# Patient Record
Sex: Male | Born: 1990 | Hispanic: Yes | Marital: Single | Smoking: Never smoker
Health system: Southern US, Community
[De-identification: ages and names within clinical notes are randomized; demographics above are authoritative.]

---

## 2010-06-03 ENCOUNTER — Emergency Department: Payer: Self-pay | Admitting: Internal Medicine

## 2014-05-06 ENCOUNTER — Emergency Department: Payer: Self-pay | Admitting: Emergency Medicine

## 2018-03-06 ENCOUNTER — Emergency Department: Payer: BLUE CROSS/BLUE SHIELD

## 2018-03-06 ENCOUNTER — Other Ambulatory Visit: Payer: Self-pay

## 2018-03-06 ENCOUNTER — Encounter: Payer: Self-pay | Admitting: *Deleted

## 2018-03-06 ENCOUNTER — Emergency Department
Admission: EM | Admit: 2018-03-06 | Discharge: 2018-03-06 | Disposition: A | Discharge status: Home / Self Care | Payer: BLUE CROSS/BLUE SHIELD | Attending: Emergency Medicine | Admitting: Emergency Medicine

## 2018-03-06 DIAGNOSIS — R066 Hiccough: Secondary | ICD-10-CM | POA: Insufficient documentation

## 2018-03-06 LAB — CBC
HCT: 48.8 % (ref 40.0–52.0)
Hemoglobin: 17.2 g/dL (ref 13.0–18.0)
MCH: 31.3 pg (ref 26.0–34.0)
MCHC: 35.4 g/dL (ref 32.0–36.0)
MCV: 88.3 fL (ref 80.0–100.0)
PLATELETS: 162 10*3/uL (ref 150–440)
RBC: 5.52 MIL/uL (ref 4.40–5.90)
RDW: 13.1 % (ref 11.5–14.5)
WBC: 4.9 10*3/uL (ref 3.8–10.6)

## 2018-03-06 LAB — BASIC METABOLIC PANEL
Anion gap: 9 (ref 5–15)
BUN: 12 mg/dL (ref 6–20)
CALCIUM: 8.7 mg/dL — AB (ref 8.9–10.3)
CHLORIDE: 102 mmol/L (ref 101–111)
CO2: 27 mmol/L (ref 22–32)
Creatinine, Ser: 0.73 mg/dL (ref 0.61–1.24)
GFR calc Af Amer: >60 mL/min (ref >60)
GFR calc non Af Amer: >60 mL/min (ref >60)
Glucose, Bld: 84 mg/dL (ref 65–99)
Potassium: 3.2 mmol/L — ABNORMAL LOW (ref 3.5–5.1)
Sodium: 138 mmol/L (ref 135–145)

## 2018-03-06 LAB — TROPONIN I: Troponin I: <0.03 ng/mL (ref <0.03)

## 2018-03-06 MED ORDER — CHLORPROMAZINE HCL 50 MG PO TABS: 50.0000 mg | ORAL_TABLET | Freq: Three times a day (TID) | ORAL | 0 refills | Status: DC | PRN

## 2018-03-06 MED ORDER — GI COCKTAIL ~~LOC~~
30.0000 mL | Freq: Once | ORAL | Status: AC
  Administered 2018-03-06: 30 mL via ORAL
  Filled 2018-03-06: qty 30

## 2018-03-06 MED ORDER — CHLORPROMAZINE HCL 25 MG/ML IJ SOLN
50.0000 mg | Freq: Once | INTRAMUSCULAR | Status: AC
  Administered 2018-03-06: 50 mg via INTRAMUSCULAR
  Filled 2018-03-06: qty 2

## 2018-03-06 NOTE — ED Notes (Signed)
No answer when called from lobby x1 

## 2018-03-06 NOTE — ED Notes (Signed)
Pt given ginger ale and tolerated well.  

## 2018-03-06 NOTE — ED Notes (Signed)
Patient up to nurse first asking how long before he goes back to a room. Has been sleeping in the corner of lobby. Updated on delay

## 2018-03-06 NOTE — ED Notes (Signed)
No answer when called from lobby x3. 

## 2018-03-06 NOTE — ED Notes (Signed)
Pt reports his hiccups are gone at this time.

## 2018-03-06 NOTE — ED Provider Notes (Signed)
[facility] Emergency Department Provider Note   ____________________________________________   First MD Initiated Contact with Patient 03/06/18 0425     (approximate)  I have reviewed the triage vital signs and the nursing notes.   HISTORY  Chief Complaint Hiccups    HPI Lawrence Arellano is a 27 y.o. male who presents to the ED from home with a chief complaint of hiccups.  Patient reports a 2-day history of hiccups, heartburn and belching.  The day before he went to a nonlocal urgent care and received a shot for allergic reaction.  Patient believes this shot precipitated his intractable hiccups.  Denies associated fever, chills, chest pain, shortness of breath, abdominal pain, nausea or vomiting.  Denies recent travel or trauma.  Endorses spicy foods.   Past medical history None  There are no active problems to display for this patient.   History reviewed. No pertinent surgical history.  Prior to Admission medications   Not on File    Allergies Patient has no known allergies.  No family history on file.  Social History Social History   Tobacco Use  . Smoking status: Never Smoker  Substance Use Topics  . Alcohol use: Yes    Frequency: Never  . Drug use: Never    Review of Systems  Constitutional: No fever/chills Eyes: No visual changes. ENT: No sore throat. Cardiovascular: Denies chest pain. Respiratory: Denies shortness of breath. Gastrointestinal: Positive for hiccups.  No abdominal pain.  No nausea, no vomiting.  No diarrhea.  No constipation. Genitourinary: Negative for dysuria. Musculoskeletal: Negative for back pain. Skin: Negative for rash. Neurological: Negative for headaches, focal weakness or numbness.   ____________________________________________   PHYSICAL EXAM:  VITAL SIGNS: ED Triage Vitals  Enc Vitals Group     BP 03/06/18 0008 130/65     Pulse Rate 03/06/18 0008 69     Resp 03/06/18 0008 20   Temp 03/06/18 0008 98.4 F (36.9 C)     Temp Source 03/06/18 0008 Oral     SpO2 03/06/18 0008 98 %     Weight 03/06/18 0012 140 lb (63.5 kg)     Height 03/06/18 0012 5\' 6"  (1.676 m)     Head Circumference --      Peak Flow --      Pain Score 03/06/18 0011 6     Pain Loc --      Pain Edu? --      Excl. in GC? --     Constitutional: Alert and oriented. Well appearing and in mild acute distress. Eyes: Conjunctivae are normal. PERRL. EOMI. Head: Atraumatic. Nose: No congestion/rhinnorhea. Mouth/Throat: Mucous membranes are moist.  Oropharynx non-erythematous. Neck: No stridor.   Cardiovascular: Normal rate, regular rhythm. Grossly normal heart sounds.  Good peripheral circulation. Respiratory: Normal respiratory effort.  No retractions. Lungs CTAB. Gastrointestinal: Active hiccups.  Soft and nontender to light or deep palpation. No distention. No abdominal bruits. No CVA tenderness. Musculoskeletal: No lower extremity tenderness nor edema.  No joint effusions. Neurologic:  Normal speech and language. No gross focal neurologic deficits are appreciated. No gait instability. Skin:  Skin is warm, dry and intact. No rash noted. Psychiatric: Mood and affect are normal. Speech and behavior are normal.  ____________________________________________   LABS (all labs ordered are listed, but only abnormal results are displayed)  Labs Reviewed  BASIC METABOLIC PANEL - Abnormal; Notable for the following components:      Result Value   Potassium 3.2 (*)  Calcium 8.7 (*)    All other components within normal limits  CBC  TROPONIN I   ____________________________________________  EKG  None ____________________________________________  RADIOLOGY  ED MD interpretation: No acute cardiopulmonary process  Official radiology report(s): Dg Chest 2 View  Result Date: 03/06/2018 CLINICAL DATA:  Heartburn, belching EXAM: CHEST - 2 VIEW COMPARISON:  None. FINDINGS: The heart size and  mediastinal contours are within normal limits. Both lungs are clear. The visualized skeletal structures are unremarkable. IMPRESSION: No active cardiopulmonary disease. Electronically Signed   By: Jasmine PangKim  Fujinaga M.D.   On: 03/06/2018 00:59    ____________________________________________   PROCEDURES  Procedure(s) performed: None  Procedures  Critical Care performed: No  ____________________________________________   INITIAL IMPRESSION / ASSESSMENT AND PLAN / ED COURSE  As part of my medical decision making, I reviewed the following data within the electronic MEDICAL RECORD NUMBER Nursing notes reviewed and incorporated, Labs reviewed, Old chart reviewed, Radiograph reviewed  and Notes from prior ED visits   27 year old male who presents with intractable hiccups and heartburn.  Differential diagnosis includes but is not limited to esophagitis, gastritis, GERD, SBO, etc.  Initial laboratory and chest x-ray results unremarkable.  Will administer IM Thorazine, GI cocktail and reassess.   Clinical Course as of Mar 06 554  Sat Mar 06, 2018  0554 Hiccups resolved.  Patient was able to tolerate PO without hiccups.  Will discharge home with prescription for Thorazine to use as needed.  Strict return precautions given.  Patient verbalizes understanding and agrees with plan of care.   [JS]    Clinical Course User Index [JS] Irean HongSung, Jade J, MD     ____________________________________________   FINAL CLINICAL IMPRESSION(S) / ED DIAGNOSES  Final diagnoses:  Hiccups     ED Discharge Orders    None       Note:  This document was prepared using Dragon voice recognition software and may include unintentional dictation errors.    Irean HongSung, Jade J, MD 03/06/18 0630

## 2018-03-06 NOTE — Discharge Instructions (Addendum)
1.  You may take Thorazine 3 times daily as needed for hiccups. 2.  Return to the ER for worsening symptoms, persistent vomiting, difficulty breathing or other concerns.

## 2018-03-06 NOTE — ED Notes (Signed)
Pt reports he was seen at urgent care in [place] on Wednesday for an allergic reaction and given a shot of medication. On Thursday he started having hiccups and reflux type sx. Pt reports last BM was today and has been passing gas.

## 2018-03-06 NOTE — ED Notes (Signed)
No answer when called from lobby x2. 

## 2018-03-06 NOTE — ED Triage Notes (Signed)
Pt reports heartburn, hiccups, and belching, since yesterday. He took OTC med today for heart burn without relief.

## 2018-11-25 ENCOUNTER — Other Ambulatory Visit: Payer: Self-pay

## 2018-11-25 ENCOUNTER — Emergency Department
Admission: EM | Admit: 2018-11-25 | Discharge: 2018-11-25 | Disposition: A | Discharge status: Home / Self Care | Payer: BLUE CROSS/BLUE SHIELD | Attending: Student in an Organized Health Care Education/Training Program | Admitting: Student in an Organized Health Care Education/Training Program

## 2018-11-25 ENCOUNTER — Encounter: Payer: Self-pay | Admitting: Emergency Medicine

## 2018-11-25 DIAGNOSIS — J101 Influenza due to other identified influenza virus with other respiratory manifestations: Secondary | ICD-10-CM | POA: Insufficient documentation

## 2018-11-25 DIAGNOSIS — J111 Influenza due to unidentified influenza virus with other respiratory manifestations: Secondary | ICD-10-CM

## 2018-11-25 LAB — INFLUENZA PANEL BY PCR (TYPE A & B)
Influenza A By PCR: NEGATIVE
Influenza B By PCR: POSITIVE — AB

## 2018-11-25 NOTE — ED Triage Notes (Signed)
Patient ambulatory to triage with steady gait, without difficulty or distress noted, mask in place; pt reports fever today accomp by cough & congestion; pt did travel to [place] 2wks ago

## 2018-11-25 NOTE — ED Provider Notes (Signed)
[facility] Emergency Department Provider Note ____________________________________________  Time seen: 2219  I have reviewed the triage vital signs and the nursing notes.  HISTORY  Chief Complaint  Fever  HPI Lawrence Arellano is a 28 y.o. male presents himself to the ED for evaluation of sudden onset of fevers, cough, congestion, malaise, and eye irritation.  Patient denies any sick contacts, but does admit to a trip to [place] 2 weeks prior.  He did not receive the seasonal flu vaccine.  He has been using Benadryl for eye redness and itching.  He denies any nausea, vomiting, chest pain, shortness of breath, or syncope.  History reviewed. No pertinent past medical history.  There are no active problems to display for this patient.  History reviewed. No pertinent surgical history.  Prior to Admission medications   Not on File    Allergies Patient has no known allergies.  No family history on file.  Social History Social History   Tobacco Use  . Smoking status: Never Smoker  . Smokeless tobacco: Never Used  Substance Use Topics  . Alcohol use: Yes    Frequency: Never  . Drug use: Never    Review of Systems  Constitutional: Significant for fever. Eyes: Negative for visual changes. ENT: Negative for sore throat.  Reports nasal congestion. Cardiovascular: Negative for chest pain. Respiratory: Negative for shortness of breath.  Reports intermittent cough. Gastrointestinal: Negative for abdominal pain, vomiting and diarrhea. Genitourinary: Negative for dysuria. Musculoskeletal: Negative for back pain. Skin: Negative for rash. Neurological: Negative for headaches, focal weakness or numbness. ____________________________________________  PHYSICAL EXAM:  VITAL SIGNS: ED Triage Vitals [11/25/18 2120]  Enc Vitals Group     BP 140/85     Pulse Rate 93     Resp 18     Temp 99.3 F (37.4 C)     Temp Source Oral     SpO2 98 %     Weight 142  lb (64.4 kg)     Height 5\' 6"  (1.676 m)     Head Circumference      Peak Flow      Pain Score 0     Pain Loc      Pain Edu?      Excl. in GC?     Constitutional: Alert and oriented. Well appearing and in no distress. Head: Normocephalic and atraumatic. Eyes: Conjunctivae are normal. Normal extraocular movements Cardiovascular: Normal rate, regular rhythm. Normal distal pulses. Respiratory: Normal respiratory effort. No wheezes/rales/rhonchi. Gastrointestinal: Soft and nontender. No distention. Musculoskeletal: Nontender with normal range of motion in all extremities.  Neurologic:  Normal gait without ataxia. Normal speech and language. No gross focal neurologic deficits are appreciated. Skin:  Skin is warm, dry and intact. No rash noted. ____________________________________________   LABS (pertinent positives/negatives) Labs Reviewed  INFLUENZA PANEL BY PCR (TYPE A & B) - Abnormal; Notable for the following components:      Result Value   Influenza B By PCR POSITIVE (*)    All other components within normal limits  ____________________________________________  PROCEDURES  Procedures ____________________________________________  INITIAL IMPRESSION / ASSESSMENT AND PLAN / ED COURSE  With ED evaluation of sudden onset of fevers, cough, congestion, and malaise.  Patient was found to be influenza B positive on his PCR screen.  He is not inclined to take Tamiflu at this time.  He is discharged with instructions to take over-the-counter medications for symptom relief.  He will follow with the primary provider or return to the  ED as needed.  A work note is provided for the remainder of the week. ____________________________________________  FINAL CLINICAL IMPRESSION(S) / ED DIAGNOSES  Final diagnoses:  Influenza      Karmen Stabs, Charlesetta Ivory, PA-C 11/25/18 2257    Willy Eddy, MD 11/25/18 2257

## 2018-11-25 NOTE — Discharge Instructions (Signed)
You have tested positive for the flu. Take OTC Benadryl, Tylenol, and Motrin for symptoms. Rest and drink plenty of fluids to get better.

## 2019-01-22 IMAGING — CR DG CHEST 2V
2 series · 2 of 2 positions shown · non-contrast
Comparison: None.

CLINICAL DATA: Heartburn, belching

EXAM:
CHEST - 2 VIEW

[chest pa]
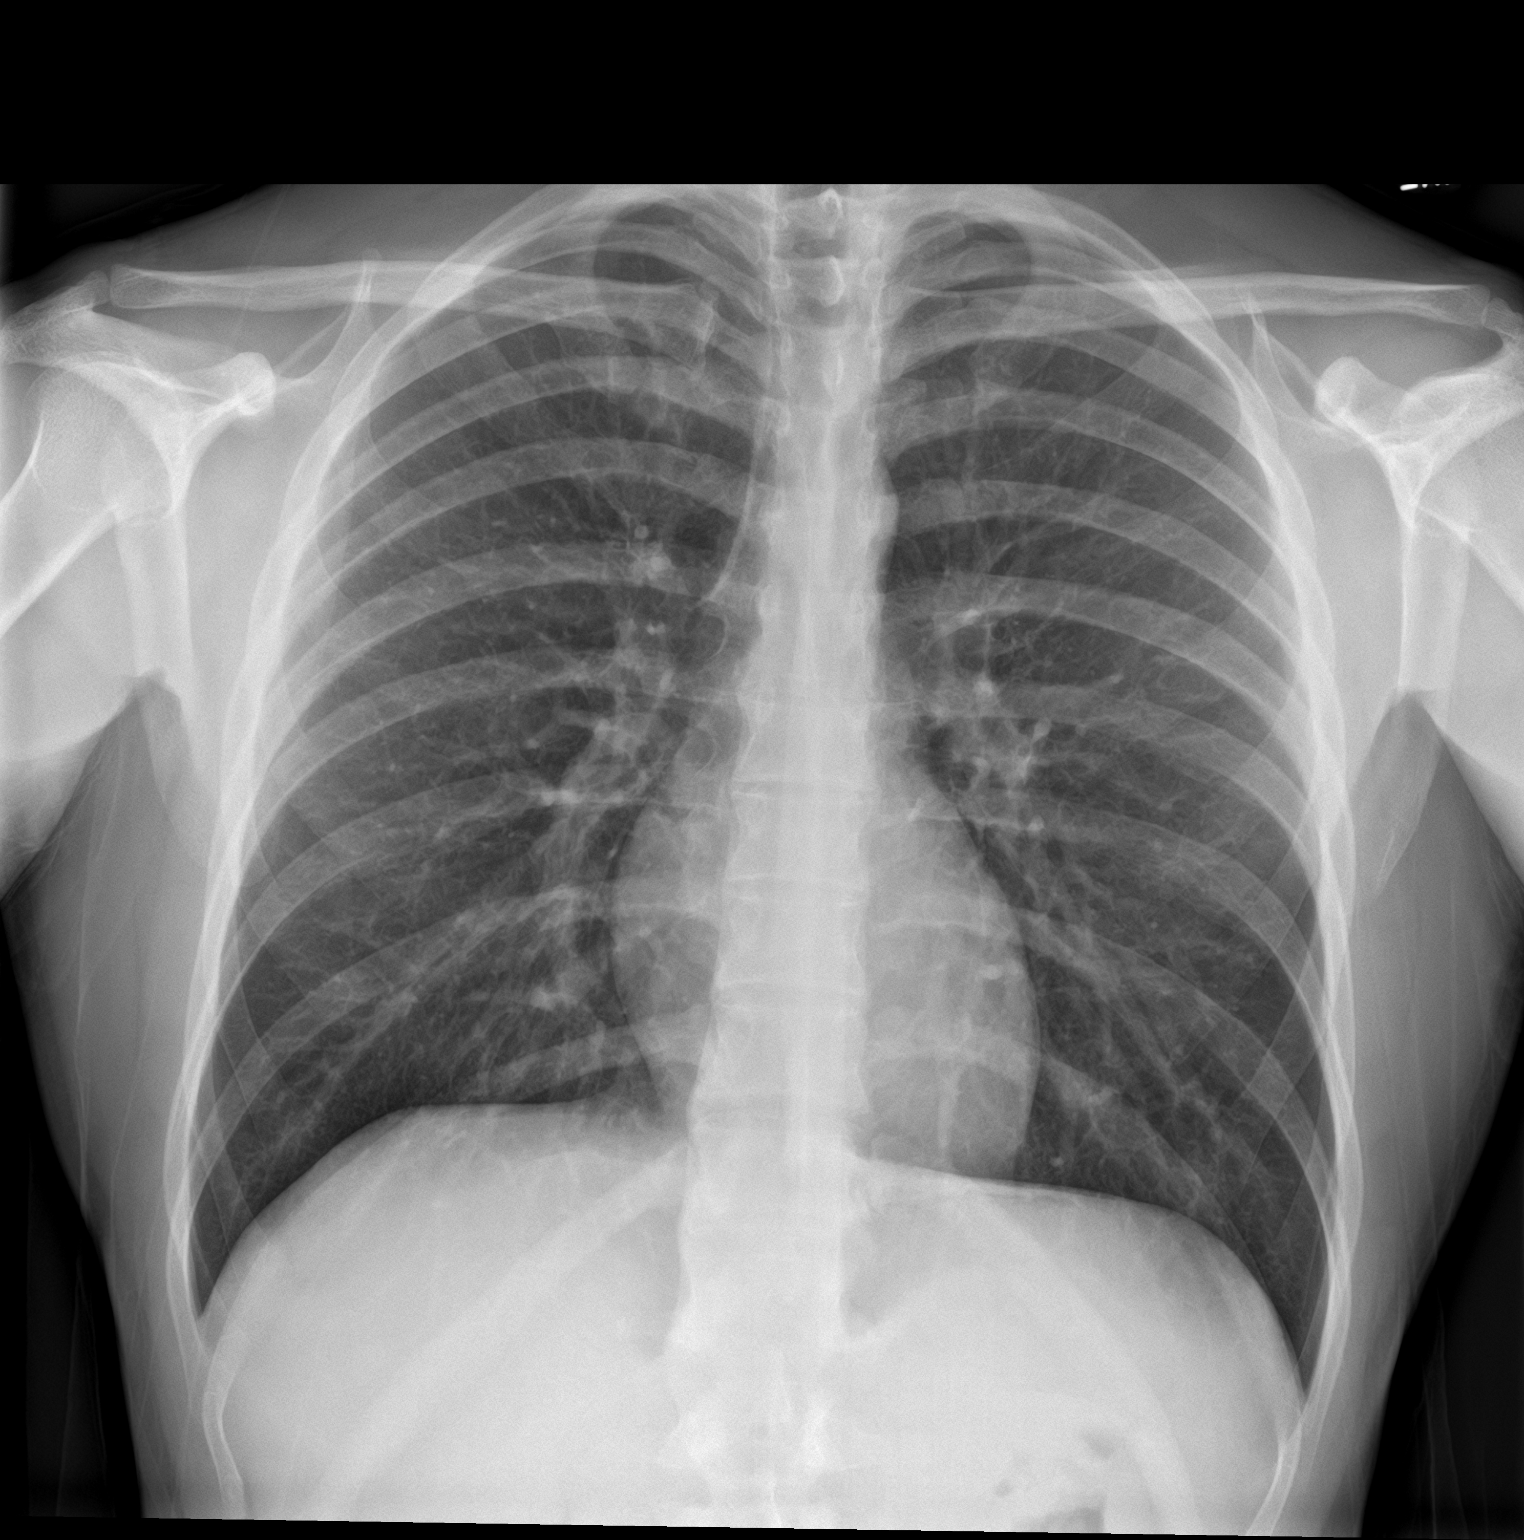

[chest lat]
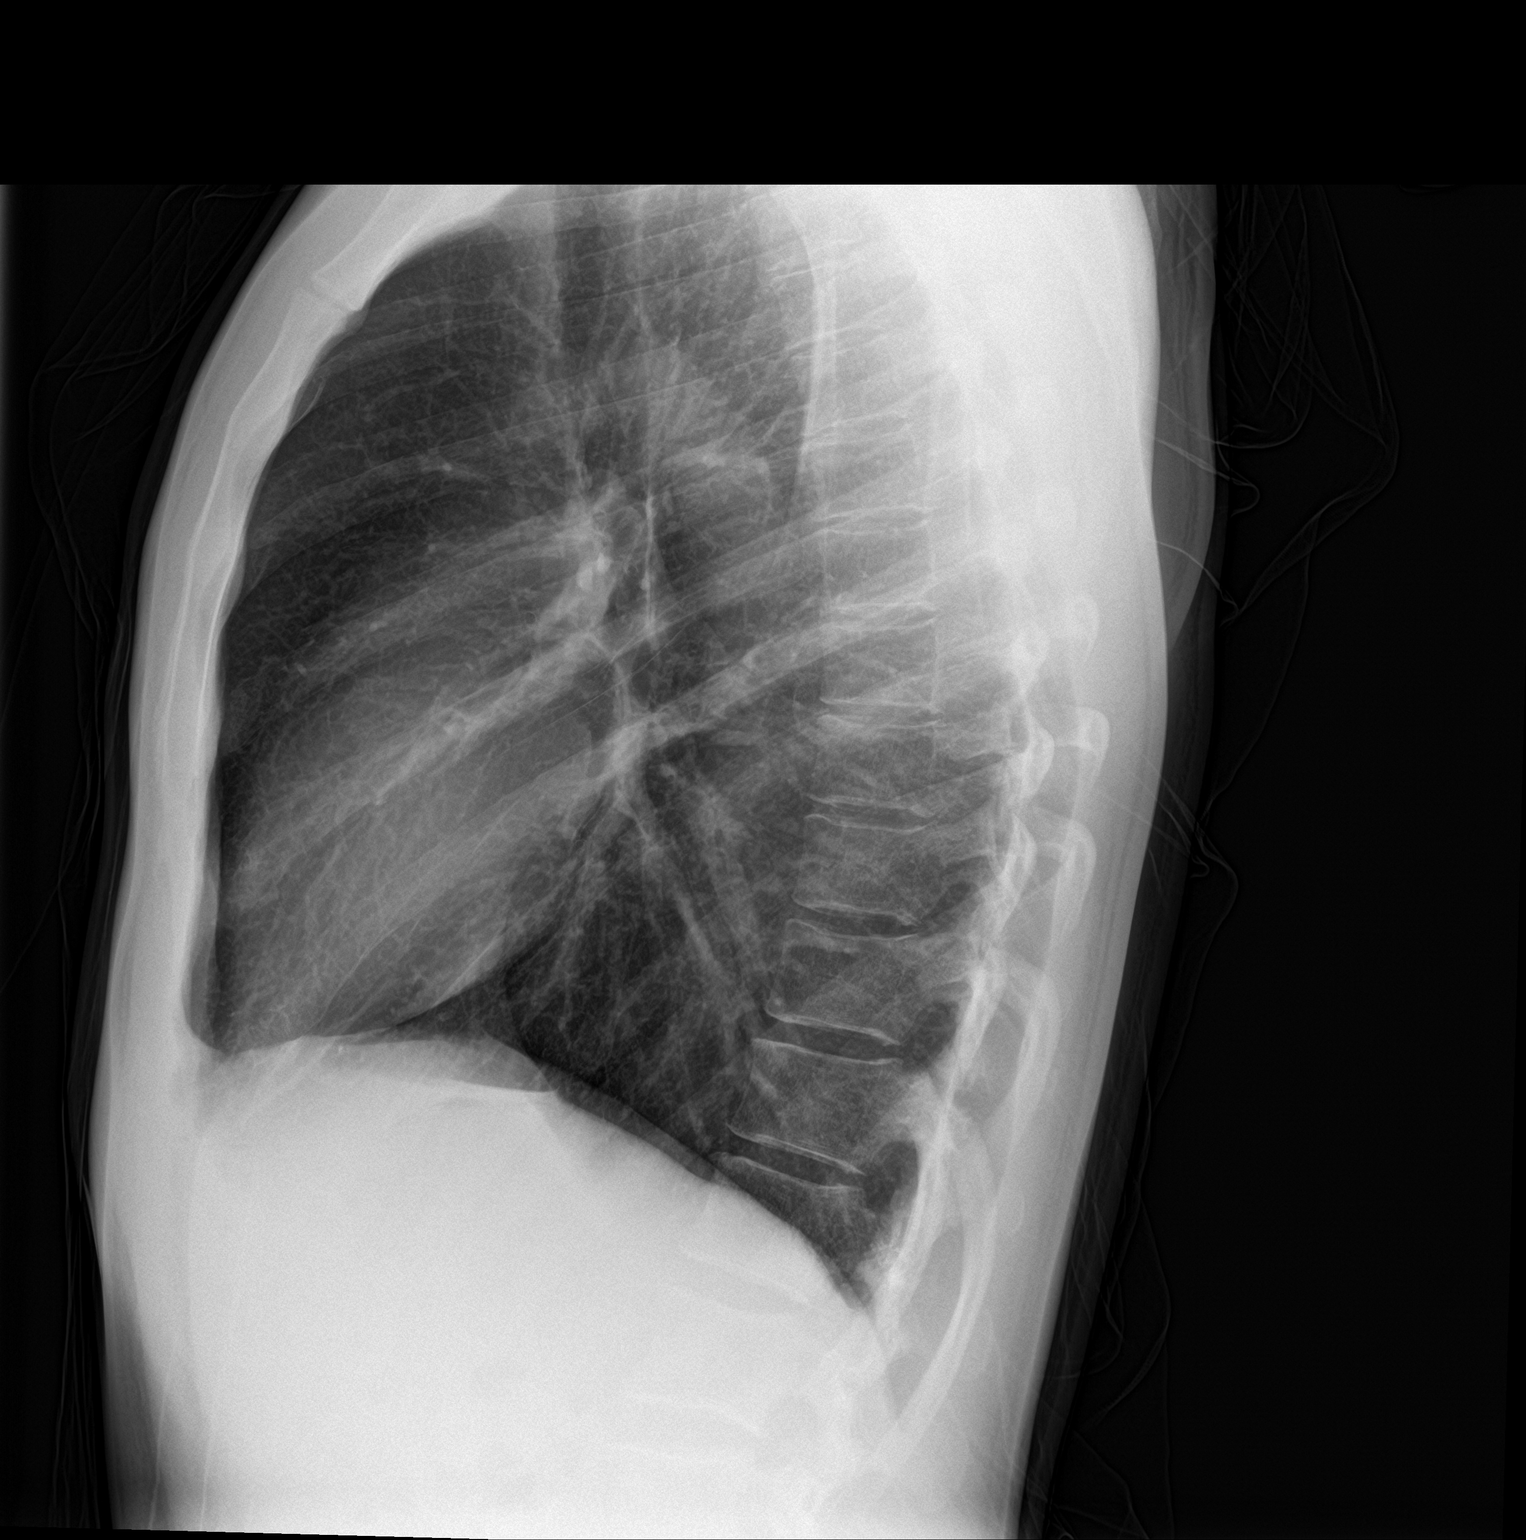

[2 of 2 positions shown; findings below may reference images not displayed]

FINDINGS: The heart size and mediastinal contours are within normal limits.
Both lungs are clear. The visualized skeletal structures are
unremarkable.
IMPRESSION: No active cardiopulmonary disease.
